# Patient Record
Sex: Female | Born: 1980 | Race: White | Hispanic: No | Marital: Married | Smoking: Never smoker
Health system: Southern US, Community
[De-identification: ages and names within clinical notes are randomized; demographics above are authoritative.]

## PROBLEM LIST (undated history)

## (undated) DIAGNOSIS — L709 Acne, unspecified: Secondary | ICD-10-CM

## (undated) DIAGNOSIS — I341 Nonrheumatic mitral (valve) prolapse: Secondary | ICD-10-CM

## (undated) HISTORY — DX: Nonrheumatic mitral (valve) prolapse: I34.1

## (undated) HISTORY — DX: Acne, unspecified: L70.9

---

## 2006-06-24 ENCOUNTER — Other Ambulatory Visit: Admission: RE | Admit: 2006-06-24 | Discharge: 2006-06-24 | Payer: Self-pay | Admitting: Gynecology

## 2007-07-01 ENCOUNTER — Other Ambulatory Visit: Admission: RE | Admit: 2007-07-01 | Discharge: 2007-07-01 | Payer: Self-pay | Admitting: Gynecology

## 2007-10-24 ENCOUNTER — Ambulatory Visit (HOSPITAL_COMMUNITY): Admission: RE | Admit: 2007-10-24 | Discharge: 2007-10-24 | Payer: Self-pay | Admitting: Orthopedic Surgery

## 2007-10-27 HISTORY — PX: KNEE SURGERY: SHX244

## 2008-12-12 ENCOUNTER — Encounter: Payer: Self-pay | Admitting: Women's Health

## 2008-12-12 ENCOUNTER — Ambulatory Visit: Payer: Self-pay | Admitting: Women's Health

## 2008-12-12 ENCOUNTER — Other Ambulatory Visit: Admission: RE | Admit: 2008-12-12 | Discharge: 2008-12-12 | Payer: Self-pay | Admitting: Gynecology

## 2008-12-14 ENCOUNTER — Ambulatory Visit: Payer: Self-pay | Admitting: Women's Health

## 2008-12-17 ENCOUNTER — Ambulatory Visit: Payer: Self-pay | Admitting: Women's Health

## 2009-01-02 ENCOUNTER — Ambulatory Visit: Payer: Self-pay | Admitting: Women's Health

## 2009-04-08 ENCOUNTER — Ambulatory Visit (HOSPITAL_COMMUNITY): Admission: RE | Admit: 2009-04-08 | Discharge: 2009-04-08 | Payer: Self-pay | Admitting: Obstetrics and Gynecology

## 2009-05-06 ENCOUNTER — Ambulatory Visit (HOSPITAL_COMMUNITY): Admission: RE | Admit: 2009-05-06 | Discharge: 2009-05-06 | Payer: Self-pay | Admitting: Obstetrics and Gynecology

## 2009-09-05 ENCOUNTER — Inpatient Hospital Stay (HOSPITAL_COMMUNITY): Admission: AD | Admit: 2009-09-05 | Discharge: 2009-09-07 | Payer: Self-pay | Admitting: Obstetrics and Gynecology

## 2010-08-18 ENCOUNTER — Ambulatory Visit: Payer: Self-pay | Admitting: Women's Health

## 2010-12-08 IMAGING — US US OB DETAIL+14 WK
1 series · 14 of 28 positions shown · non-contrast
Comparison: none

OBSTETRICAL ULTRASOUND:
 This ultrasound was performed in The [HOSPITAL], and the [facility] report will be stored to [REDACTED] PACS.

[Series 1: us ob detail+14 wk · 14 of 99 slices shown]
[im 4/99]
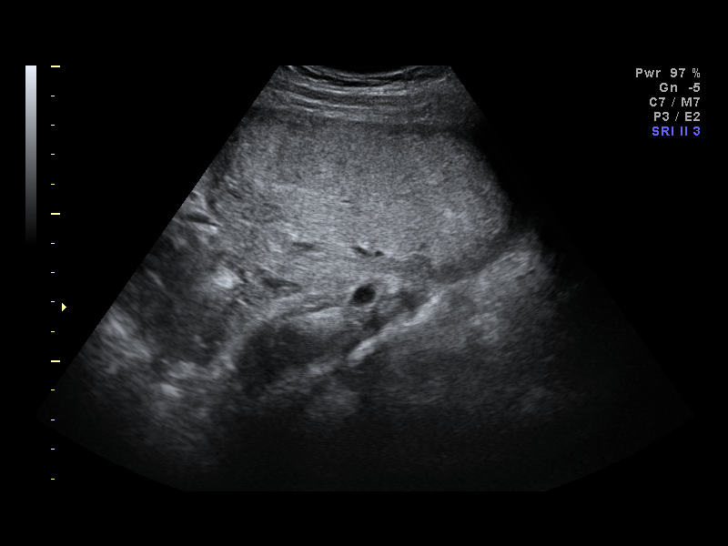
[im 11/99]
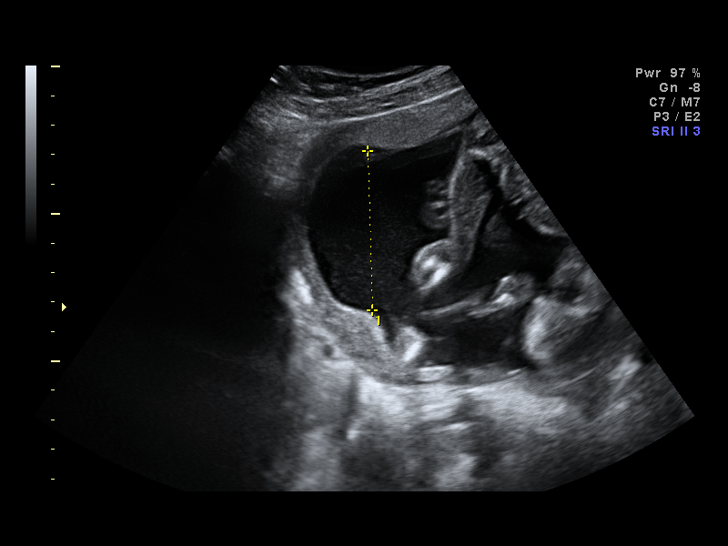
[im 19/99]
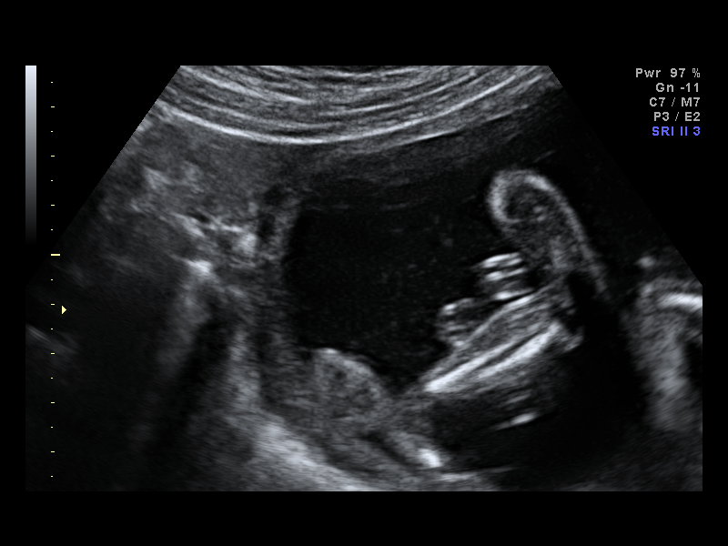
[im 26/99]
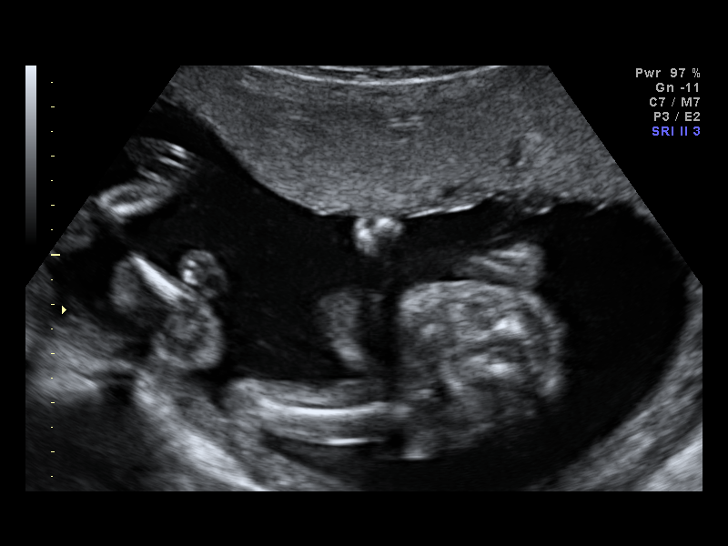
[im 33/99]
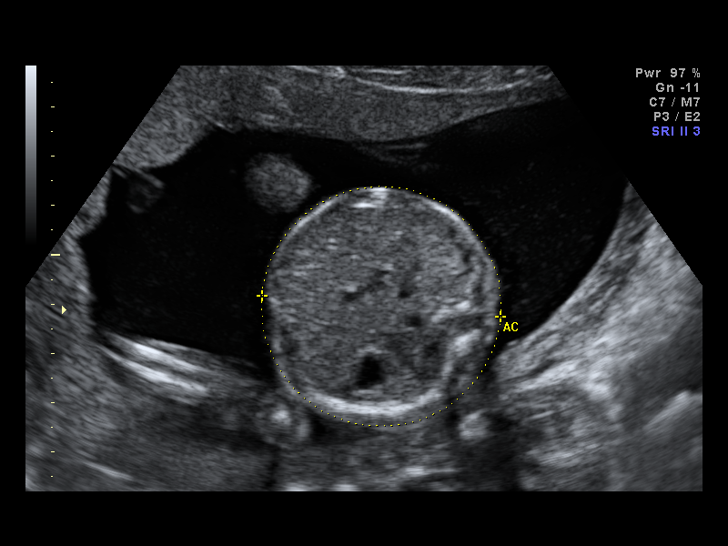
[im 40/99]
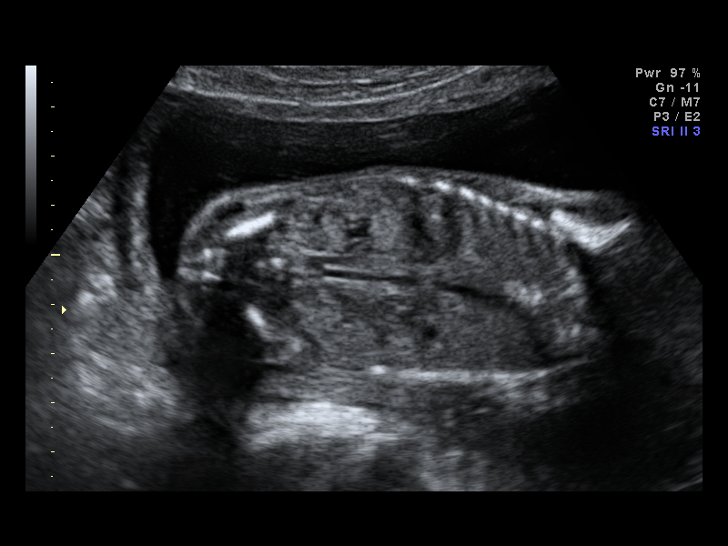
[im 48/99]
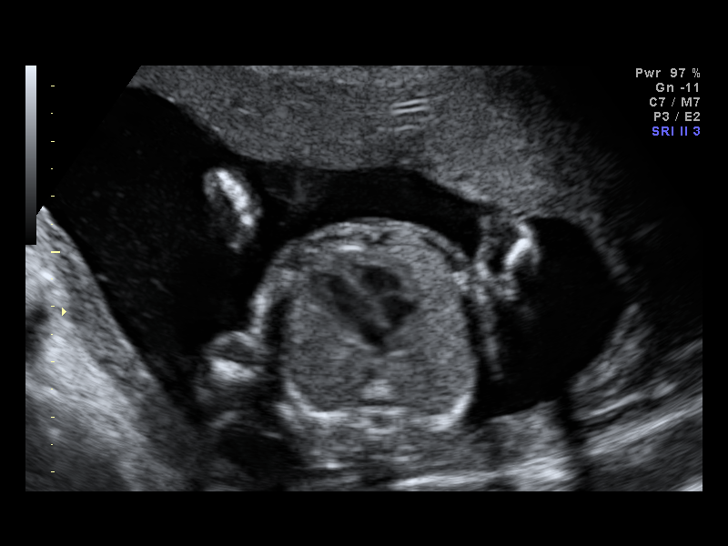
[im 55/99]
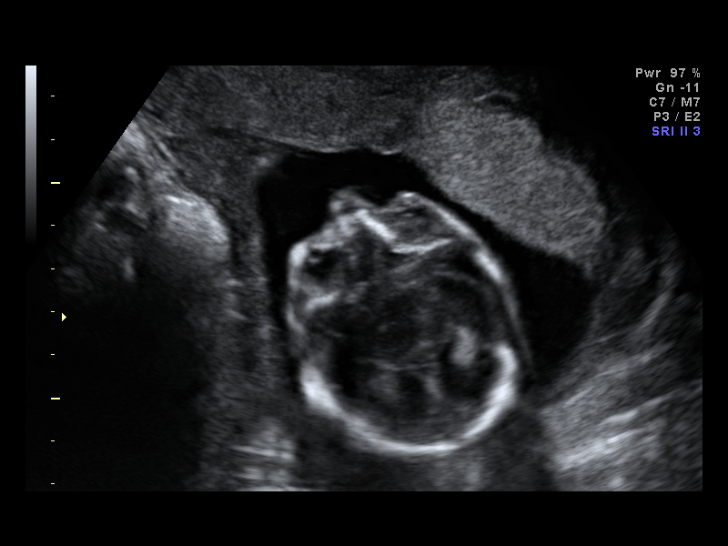
[im 62/99]
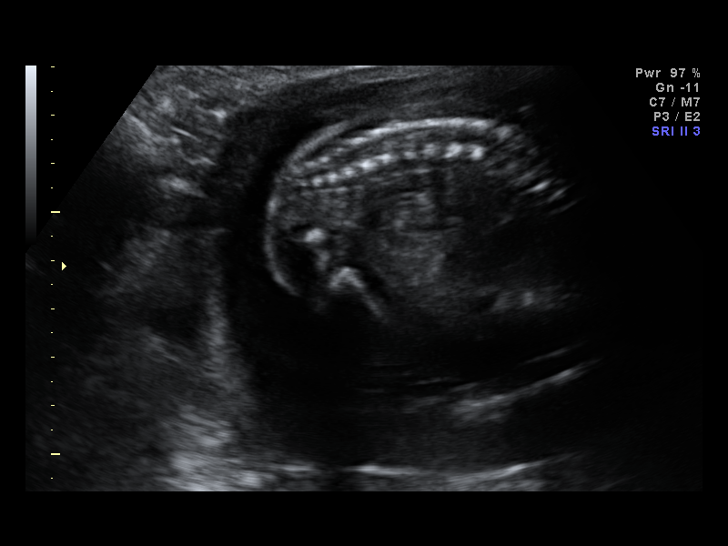
[im 69/99]
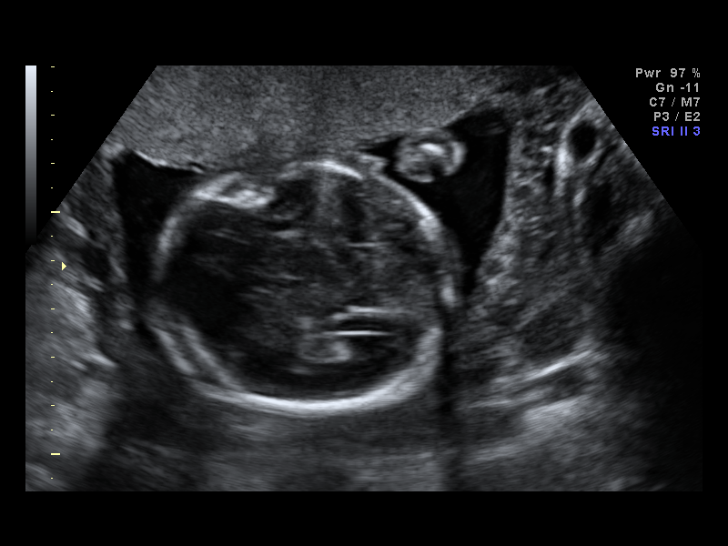
[im 77/99]
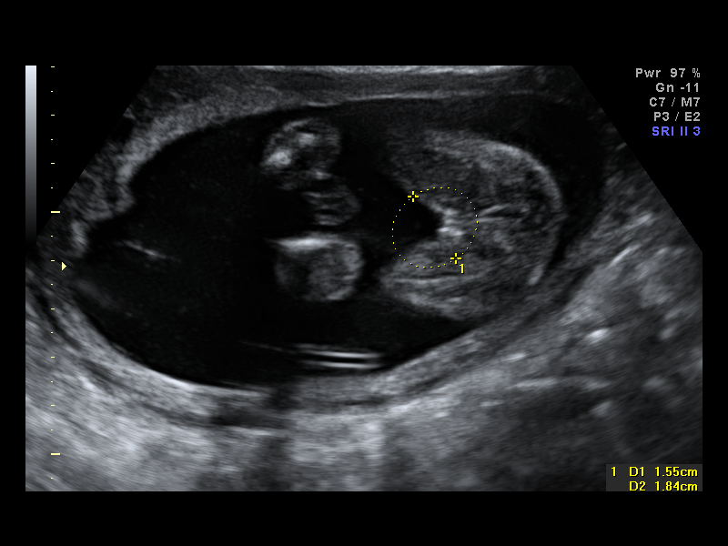
[im 84/99]
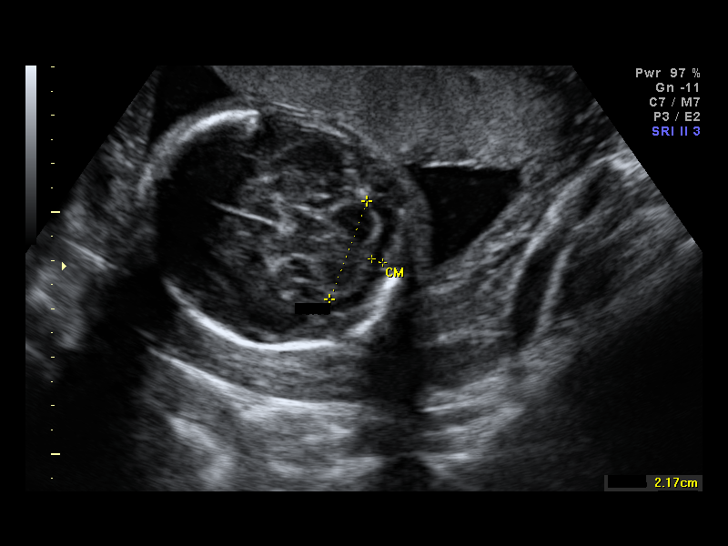
[im 91/99]
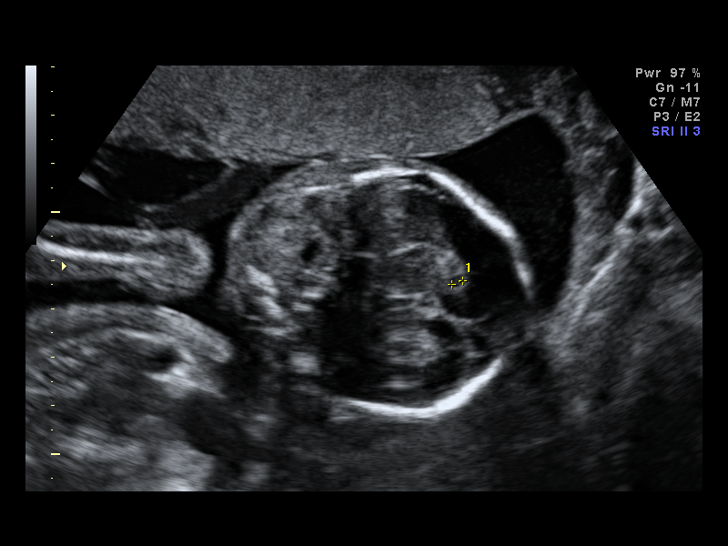
[im 99/99]
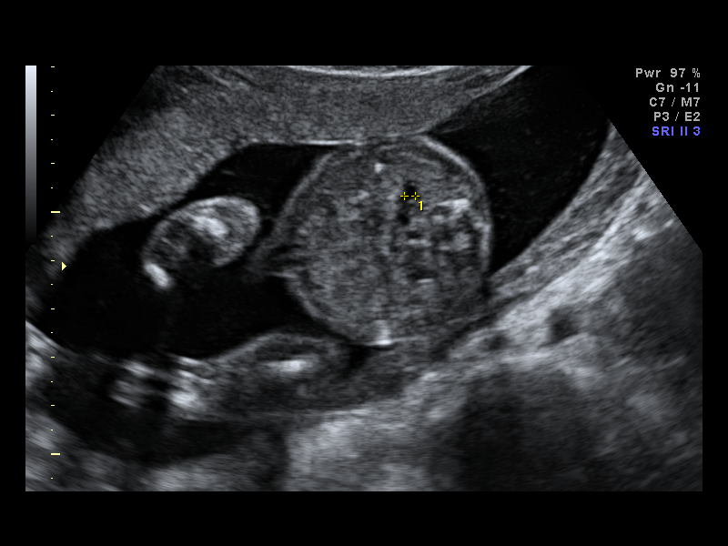

[14 of 28 positions shown; findings below may reference images not displayed]

IMPRESSION: [facility] has also been faxed to the ordering physician.

## 2011-01-28 LAB — CBC
HCT: 28.9 % — ABNORMAL LOW (ref 36.0–46.0)
HCT: 39 % (ref 36.0–46.0)
Hemoglobin: 13.1 g/dL (ref 12.0–15.0)
Hemoglobin: 9.7 g/dL — ABNORMAL LOW (ref 12.0–15.0)
MCHC: 33.6 g/dL (ref 30.0–36.0)
MCHC: 33.6 g/dL (ref 30.0–36.0)
MCV: 95.1 fL (ref 78.0–100.0)
MCV: 95.4 fL (ref 78.0–100.0)
Platelets: 157 10*3/uL (ref 150–400)
RBC: 4.11 MIL/uL (ref 3.87–5.11)
RDW: 13.5 % (ref 11.5–15.5)
WBC: 11 10*3/uL — ABNORMAL HIGH (ref 4.0–10.5)
WBC: 11.1 10*3/uL — ABNORMAL HIGH (ref 4.0–10.5)

## 2011-01-28 LAB — RPR: RPR Ser Ql: NONREACTIVE

## 2011-07-06 DIAGNOSIS — I341 Nonrheumatic mitral (valve) prolapse: Secondary | ICD-10-CM | POA: Insufficient documentation

## 2011-07-06 DIAGNOSIS — L709 Acne, unspecified: Secondary | ICD-10-CM | POA: Insufficient documentation

## 2011-07-15 ENCOUNTER — Encounter: Payer: Self-pay | Admitting: Women's Health

## 2011-07-28 ENCOUNTER — Encounter: Payer: Self-pay | Admitting: Women's Health

## 2011-08-20 ENCOUNTER — Encounter: Payer: Self-pay | Admitting: Women's Health

## 2011-08-31 ENCOUNTER — Encounter: Payer: Self-pay | Admitting: Women's Health

## 2011-08-31 ENCOUNTER — Ambulatory Visit (INDEPENDENT_AMBULATORY_CARE_PROVIDER_SITE_OTHER): Payer: Commercial Managed Care - PPO | Admitting: Women's Health

## 2011-08-31 ENCOUNTER — Other Ambulatory Visit (HOSPITAL_COMMUNITY)
Admission: RE | Admit: 2011-08-31 | Discharge: 2011-08-31 | Disposition: A | Discharge status: Home / Self Care | Payer: Commercial Managed Care - PPO | Source: Ambulatory Visit | Attending: Women's Health | Admitting: Women's Health

## 2011-08-31 VITALS — BP 110/60 | Ht 64.0 in | Wt 119.0 lb

## 2011-08-31 DIAGNOSIS — Z01419 Encounter for gynecological examination (general) (routine) without abnormal findings: Secondary | ICD-10-CM | POA: Insufficient documentation

## 2011-08-31 DIAGNOSIS — R809 Proteinuria, unspecified: Secondary | ICD-10-CM

## 2011-08-31 NOTE — Progress Notes (Signed)
Heidi Montgomery Feb 26, 1981 [id number]    History:    The patient presents for annual exam.  Daughter Lauris Poag is 2 and doing well, delivered by Dr. Cherly Hensen. Training for a half marathon.   Past medical history, past surgical history, family history and social history were all reviewed and documented in the EPIC chart.   ROS:  A  ROS was performed and pertinent positives and negatives are included in the history.  Exam:  Filed Vitals:   08/31/11 0810  BP: 110/60    General appearance:  Normal Head/Neck:  Normal, without cervical or supraclavicular adenopathy. Thyroid:  Symmetrical, normal in size, without palpable masses or nodularity. Respiratory  Effort:  Normal  Auscultation:  Clear without wheezing or rhonchi Cardiovascular  Auscultation:  Regular rate, without rubs, murmurs or gallops  Edema/varicosities:  Not grossly evident Abdominal  Soft,nontender, without masses, guarding or rebound.  Liver/spleen:  No organomegaly noted  Hernia:  None appreciated  Skin  Inspection:  Grossly normal  Palpation:  Grossly normal Neurologic/psychiatric  Orientation:  Normal with appropriate conversation.  Mood/affect:  Normal  Genitourinary    Breasts: Examined lying and sitting.     Right: Without masses, retractions, discharge or axillary adenopathy.     Left: Without masses, retractions, discharge or axillary adenopathy.   Inguinal/mons:  Normal without inguinal adenopathy  External genitalia:  Normal  BUS/Urethra/Skene's glands:  Normal  Bladder:  Normal  Vagina:  Normal  Cervix:  Normal  Uterus:  normal in size, shape and contour.  Midline and mobile  Adnexa/parametria:     Rt: Without masses or tenderness.   Lt: Without masses or tenderness.  Anus and perineum: Normal  Digital rectal exam: Normal sphincter tone without palpated masses or tenderness  Assessment/Plan:  30 y.o. MWF G1P1 for annual exam, monthly 4-5 day cycle with no complaints. Condoms for contraception  declines other methods. Mother with history of breast cancer, diagnosed at age 32, doing well, cancer free for 13 years. BRCA testing reviewed, will discuss with mother.  Normal GYN exam  Plan: CBC UA and Pap. SBEs, continue exercise, MVI daily encouraged. Has a healthy lifestyle and will continue.   Harrington Challenger WHNP, 8:40 AM 08/31/2011

## 2012-09-12 ENCOUNTER — Ambulatory Visit (INDEPENDENT_AMBULATORY_CARE_PROVIDER_SITE_OTHER): Payer: Commercial Managed Care - PPO | Admitting: Women's Health

## 2012-09-12 ENCOUNTER — Encounter: Payer: Self-pay | Admitting: Women's Health

## 2012-09-12 VITALS — BP 110/72 | Ht 64.0 in | Wt 123.0 lb

## 2012-09-12 DIAGNOSIS — Z01419 Encounter for gynecological examination (general) (routine) without abnormal findings: Secondary | ICD-10-CM

## 2012-09-12 DIAGNOSIS — J45909 Unspecified asthma, uncomplicated: Secondary | ICD-10-CM

## 2012-09-12 MED ORDER — ALBUTEROL SULFATE (2.5 MG/3ML) 0.083% IN NEBU: 2.5000 mg | INHALATION_SOLUTION | Freq: Four times a day (QID) | RESPIRATORY_TRACT | Status: AC | PRN

## 2012-09-12 NOTE — Patient Instructions (Signed)

## 2012-09-12 NOTE — Progress Notes (Signed)
Heidi Montgomery [birth year] [id number]    History:    The patient presents for annual exam.  Monthly 4 days cycle/using no contraception desiring pregnancy. History of normal Paps. Mother with breast cancer at age 31, survivor BRCA status unknown. Asthma, uses inhaler rarely. History of MVP/no meds.  Past medical history, past surgical history, family history and social history were all reviewed and documented in the EPIC chart. 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    ROS:  A  ROS was performed and pertinent positives and negatives are included in the history.  Exam:  Filed Vitals:   09/12/12 0758  BP: 110/72    General appearance:  Normal Head/Neck:  Normal, without cervical or supraclavicular adenopathy. Thyroid:  Symmetrical, normal in size, without palpable masses or nodularity. Respiratory  Effort:  Normal  Auscultation:  Clear without wheezing or rhonchi Cardiovascular  Auscultation:  Regular rate, without rubs, murmurs or gallops  Edema/varicosities:  Not grossly evident Abdominal  Soft,nontender, without masses, guarding or rebound.  Liver/spleen:  No organomegaly noted  Hernia:  None appreciated  Skin  Inspection:  Grossly normal  Palpation:  Grossly normal Neurologic/psychiatric  Orientation:  Normal with appropriate conversation.  Mood/affect:  Normal  Genitourinary    Breasts: Examined lying and sitting.     Right: Without masses, retractions, discharge or axillary adenopathy.     Left: Without masses, retractions, discharge or axillary adenopathy.   Inguinal/mons:  Normal without inguinal adenopathy  External genitalia:  Normal  BUS/Urethra/Skene's glands:  Normal  Bladder:  Normal  Vagina:  Normal  Cervix:  Normal  Uterus:   normal in size, shape and contour.  Midline and mobile  Adnexa/parametria:     Rt: Without masses or tenderness.   Lt: Without masses or tenderness.  Anus and perineum: Normal  Digital rectal exam: Normal sphincter tone without  palpated masses or tenderness  Assessment/Plan:  30 y.o. M. WF G1 P1 for annual exam with no complaints.  Normal GYN exam desiring conception Asthma rare inhaler use MVP-no meds Mother breast cancer age 50- survivor  Plan: SBE's, continue healthy diet and routine exercise, PNV daily encouraged. Aware a safe pregnancy behaviors. Instructed to return to office for viability ultrasound with missed cycle. Aware we no longer deliver. [facility] delivered daughter.  CBC, UA,. Pap normal 2012, reviewed new screening guidelines. Reviewed BRCA testing, will discuss with mother.    Harrington Challenger [facility], 8:43 AM 09/12/2012

## 2012-09-13 LAB — CBC WITH DIFFERENTIAL/PLATELET
Eosinophils Absolute: 0.6 10*3/uL (ref 0.0–0.7)
HCT: 38.4 % (ref 36.0–46.0)
Hemoglobin: 12.7 g/dL (ref 12.0–15.0)
Lymphocytes Relative: 31 % (ref 12–46)
MCH: 29.6 pg (ref 26.0–34.0)
Monocytes Relative: 7 % (ref 3–12)
Neutro Abs: 2.2 10*3/uL (ref 1.7–7.7)
Neutrophils Relative %: 49 % (ref 43–77)
RBC: 4.29 MIL/uL (ref 3.87–5.11)

## 2012-09-13 LAB — URINALYSIS W MICROSCOPIC + REFLEX CULTURE
Crystals: NONE SEEN
Glucose, UA: NEGATIVE mg/dL
Ketones, ur: NEGATIVE mg/dL
Leukocytes, UA: NEGATIVE
Protein, ur: NEGATIVE mg/dL
RBC / HPF: >50 RBC/hpf — AB (ref <3)
Urobilinogen, UA: 0.2 mg/dL (ref 0.0–1.0)

## 2012-09-14 LAB — URINE CULTURE

## 2013-01-18 ENCOUNTER — Ambulatory Visit (INDEPENDENT_AMBULATORY_CARE_PROVIDER_SITE_OTHER): Payer: Commercial Managed Care - PPO | Admitting: Women's Health

## 2013-01-18 ENCOUNTER — Encounter: Payer: Self-pay | Admitting: Women's Health

## 2013-01-18 DIAGNOSIS — R599 Enlarged lymph nodes, unspecified: Secondary | ICD-10-CM

## 2013-01-18 NOTE — Progress Notes (Signed)
Patient ID: Heidi Montgomery, female   DOB: [birth year], 32 y.o.   MRN: [id number] Presents with the complaint of questionable lymph node swelling on right side of neck noticed several days ago. Reports more tender several days ago. States has not been sick with a cold, seasonal allergies, sore throat or fever. Regular monthly cycle desiring conception.  Exam: Appears well, 1 cm slightly enlarged lymph node on right side palpated, other lymph nodes also palpable, thyroid was not enlarged nontender. Throat clear, ears  Clear.  Right neck palpable lymph node  Plan: Reviewed lymph system, will watch at this time if it does not resolve in a week or 2 instructed to call. History of asthma, reviewed seasonal allergies may have caused.

## 2013-11-15 ENCOUNTER — Ambulatory Visit (INDEPENDENT_AMBULATORY_CARE_PROVIDER_SITE_OTHER): Payer: BC Managed Care – PPO | Admitting: Women's Health

## 2013-11-15 ENCOUNTER — Encounter: Payer: Self-pay | Admitting: Women's Health

## 2013-11-15 ENCOUNTER — Other Ambulatory Visit (HOSPITAL_COMMUNITY)
Admission: RE | Admit: 2013-11-15 | Discharge: 2013-11-15 | Disposition: A | Discharge status: Home / Self Care | Payer: BC Managed Care – PPO | Source: Ambulatory Visit | Attending: Gynecology | Admitting: Gynecology

## 2013-11-15 VITALS — BP 112/54 | Ht 63.75 in | Wt 127.4 lb

## 2013-11-15 DIAGNOSIS — Z833 Family history of diabetes mellitus: Secondary | ICD-10-CM

## 2013-11-15 DIAGNOSIS — Z01419 Encounter for gynecological examination (general) (routine) without abnormal findings: Secondary | ICD-10-CM | POA: Insufficient documentation

## 2013-11-15 DIAGNOSIS — E079 Disorder of thyroid, unspecified: Secondary | ICD-10-CM

## 2013-11-15 LAB — CBC WITH DIFFERENTIAL/PLATELET
BASOS ABS: 0 10*3/uL (ref 0.0–0.1)
Basophils Relative: 0 % (ref 0–1)
EOS ABS: 0.1 10*3/uL (ref 0.0–0.7)
Eosinophils Relative: 1 % (ref 0–5)
HEMATOCRIT: 39.4 % (ref 36.0–46.0)
Hemoglobin: 12.8 g/dL (ref 12.0–15.0)
LYMPHS ABS: 1.6 10*3/uL (ref 0.7–4.0)
LYMPHS PCT: 28 % (ref 12–46)
MCH: 29.9 pg (ref 26.0–34.0)
MCHC: 32.5 g/dL (ref 30.0–36.0)
MCV: 92.1 fL (ref 78.0–100.0)
MONO ABS: 0.4 10*3/uL (ref 0.1–1.0)
MONOS PCT: 7 % (ref 3–12)
NEUTROS ABS: 3.7 10*3/uL (ref 1.7–7.7)
NEUTROS PCT: 64 % (ref 43–77)
PLATELETS: 233 10*3/uL (ref 150–400)
RBC: 4.28 MIL/uL (ref 3.87–5.11)
RDW: 12.9 % (ref 11.5–15.5)
WBC: 5.8 10*3/uL (ref 4.0–10.5)

## 2013-11-15 LAB — URINALYSIS W MICROSCOPIC + REFLEX CULTURE
Casts: NONE SEEN
Crystals: NONE SEEN
GLUCOSE, UA: NEGATIVE mg/dL
Hgb urine dipstick: NEGATIVE
Leukocytes, UA: NEGATIVE
Nitrite: NEGATIVE
PROTEIN: 30 mg/dL — AB
SPECIFIC GRAVITY, URINE: 1.025 (ref 1.005–1.030)
UROBILINOGEN UA: 0.2 mg/dL (ref 0.0–1.0)
pH: 6 (ref 5.0–8.0)

## 2013-11-15 NOTE — Progress Notes (Signed)
Heidi Montgomery July 11, 1981 [id number]   History:    Presents for annual exam with complaint of fatigue. Regular monthly cycle/condoms. History of normal Paps. Mother with breast cancer at age 32, survivor BRCA status unknown. History of asthma uses inhaler rarely. History of MVP/no meds. Is training for boxing competitions, is following gluten free, dairy free diet.   Past medical history, past surgical history, family history and social history were all reviewed and documented in the EPIC chart. Clyde Canterbury 4 doing well. Works at [employer].    ROS:  A  ROS was performed and pertinent positives and negatives are included.  Exam:  Filed Vitals:   11/15/13 1023  BP: 112/54    General appearance:  Normal Thyroid:  Symmetrical, normal in size, without palpable masses or nodularity. Respiratory  Auscultation:  Clear without wheezing or rhonchi Cardiovascular  Auscultation:  Regular rate, without rubs, murmurs or gallops  Edema/varicosities:  Not grossly evident Abdominal  Soft,nontender, without masses, guarding or rebound.  Liver/spleen:  No organomegaly noted  Hernia:  None appreciated  Skin  Inspection:  Grossly normal   Breasts: Examined lying and sitting.     Right: Without masses, retractions, discharge or axillary adenopathy.     Left: Without masses, retractions, discharge or axillary adenopathy. Gentitourinary   Inguinal/mons:  Normal without inguinal adenopathy  External genitalia:  Normal  BUS/Urethra/Skene's glands:  Normal  Vagina:  Normal  Cervix:  Normal  Uterus:  Normal in size, shape and contour.  Midline and mobile  Adnexa/parametria:     Rt: Without masses or tenderness.   Lt: Without masses or tenderness.  Anus and perineum: Normal  Digital rectal exam: Normal sphincter tone without palpated masses or tenderness  Assessment/Plan:  33 y.o.  MWF G1P1 for annual exam with complaint of fatigue, weight gain.  Normal GYN exam/condoms History of asthma,  rare inhaler use MVP-no meds Mother breast cancer age 33- survivor  Plan: Pap. Pap normal 2012, reviewed new screening guidelines. CBC, CMP, thyroid panel.  SBE's, continue healthy diet and routine exercise, MVI daily encouraged. Reviewed BRCA testing, will discuss with mother.   [place], 11:12 AM 11/15/2013

## 2013-11-15 NOTE — Patient Instructions (Signed)

## 2013-11-16 LAB — TSH: TSH: 1.789 u[IU]/mL (ref 0.350–4.500)

## 2013-11-16 LAB — COMPREHENSIVE METABOLIC PANEL
ALBUMIN: 4.6 g/dL (ref 3.5–5.2)
ALT: 13 U/L (ref 0–35)
AST: 24 U/L (ref 0–37)
Alkaline Phosphatase: 60 U/L (ref 39–117)
BILIRUBIN TOTAL: 0.6 mg/dL (ref 0.3–1.2)
BUN: 14 mg/dL (ref 6–23)
CHLORIDE: 103 meq/L (ref 96–112)
CO2: 28 mEq/L (ref 19–32)
Calcium: 9.2 mg/dL (ref 8.4–10.5)
Creat: 0.88 mg/dL (ref 0.50–1.10)
Glucose, Bld: 85 mg/dL (ref 70–99)
Potassium: 3.9 mEq/L (ref 3.5–5.3)
SODIUM: 139 meq/L (ref 135–145)
Total Protein: 6.9 g/dL (ref 6.0–8.3)

## 2013-11-16 LAB — THYROID ANTIBODIES: Thyroglobulin Ab: <20 U/mL (ref <40.0)

## 2013-11-16 LAB — URINE CULTURE
COLONY COUNT: NO GROWTH
Organism ID, Bacteria: NO GROWTH

## 2013-11-16 LAB — T4: T4, Total: 7.6 ug/dL (ref 5.0–12.5)

## 2013-11-16 LAB — T3 UPTAKE: T3 UPTAKE: 36.4 % (ref 22.5–37.0)

## 2013-11-17 ENCOUNTER — Other Ambulatory Visit: Payer: Self-pay | Admitting: Women's Health

## 2013-11-17 MED ORDER — FLUCONAZOLE 150 MG PO TABS
150.0000 mg | ORAL_TABLET | Freq: Once | ORAL | Status: AC
Start: 2013-11-17 — End: ?

## 2014-08-10 ENCOUNTER — Other Ambulatory Visit: Payer: Self-pay

## 2014-08-27 ENCOUNTER — Encounter: Payer: Self-pay | Admitting: Women's Health

## 2024-05-12 ENCOUNTER — Encounter: Payer: Self-pay | Admitting: Advanced Practice Midwife

## 2024-05-29 ENCOUNTER — Other Ambulatory Visit: Payer: Self-pay | Admitting: Obstetrics and Gynecology

## 2024-05-29 DIAGNOSIS — R928 Other abnormal and inconclusive findings on diagnostic imaging of breast: Secondary | ICD-10-CM

## 2024-06-05 ENCOUNTER — Ambulatory Visit: Admission: RE | Admit: 2024-06-05 | Source: Ambulatory Visit

## 2024-06-05 ENCOUNTER — Ambulatory Visit
Admission: RE | Admit: 2024-06-05 | Discharge: 2024-06-05 | Disposition: A | Discharge status: Home / Self Care | Payer: PRIVATE HEALTH INSURANCE | Source: Ambulatory Visit | Attending: Obstetrics and Gynecology | Admitting: Obstetrics and Gynecology

## 2024-06-05 DIAGNOSIS — R928 Other abnormal and inconclusive findings on diagnostic imaging of breast: Secondary | ICD-10-CM
